# Patient Record
Sex: Female | Born: 2000 | Race: White | Hispanic: No | Marital: Single | Smoking: Never smoker
Health system: Northeastern US, Academic
[De-identification: ages and names within clinical notes are randomized; demographics above are authoritative.]

---
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
## 2019-07-02 IMAGING — CT CT NECK W/ CM
4 of 5 series · 16 of 33 positions shown, 18 images · IV contrast (omnipaque)
Comparison: None.

CLINICAL DATA: Sore throat stridor. Positive mono. Negative strep.

EXAM:
CT NECK WITH CONTRAST
TECHNIQUE: Multidetector CT imaging of the neck was performed using the
standard protocol following the bolus administration of intravenous
contrast.
CONTRAST:  75mL OMNIPAQUE IOHEXOL 300 MG/ML  SOLN

[Series 2: axial neck · axial · 0.56mm/px · z∈[-224,-98]mm · 4 of 105 slices shown]
[im 21/105  bone]
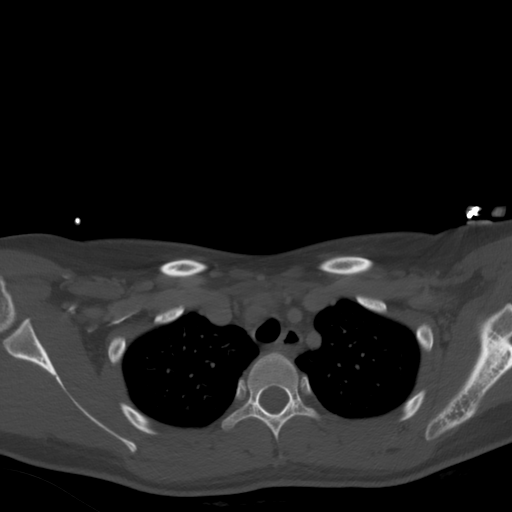
[im 42/105  bone]
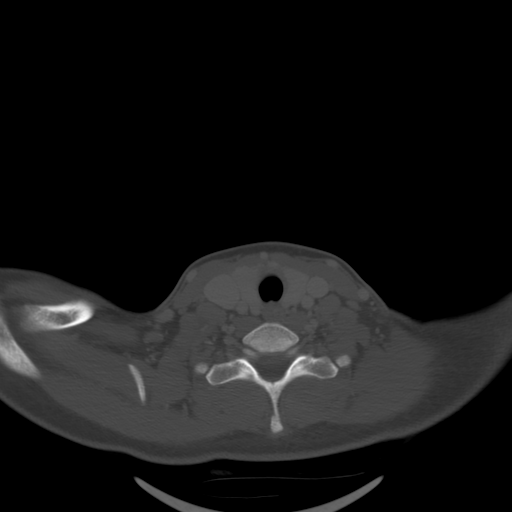
[im 63/105  bone]
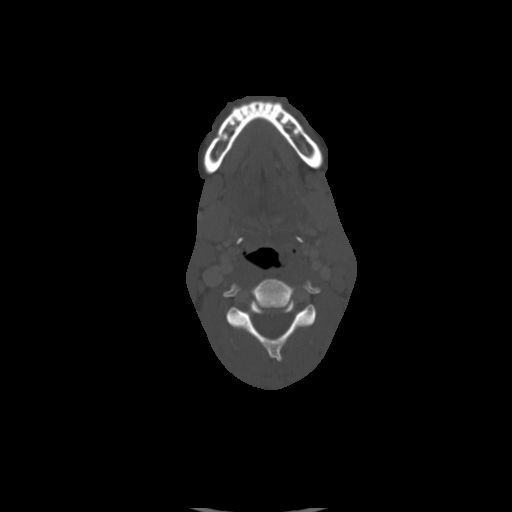
[im 84/105  bone]
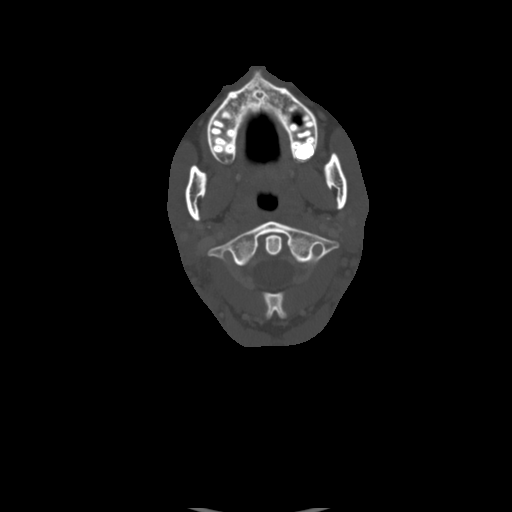

[Series 6: sag neck · sagittal · 0.42mm/px · 5 of 120 slices shown, 6 images]
[im 40/120  bone]
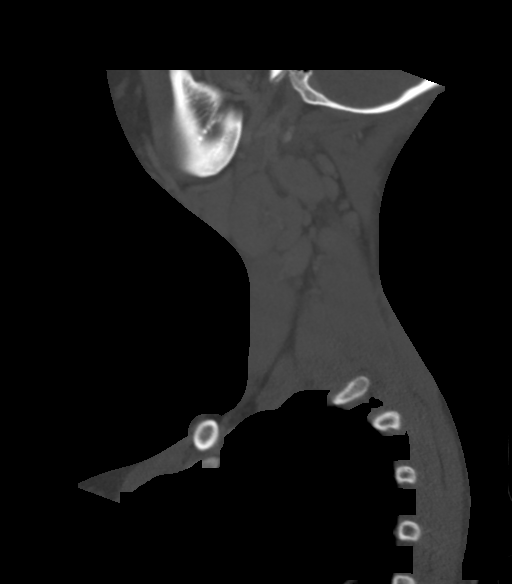
[im 50/120  bone]
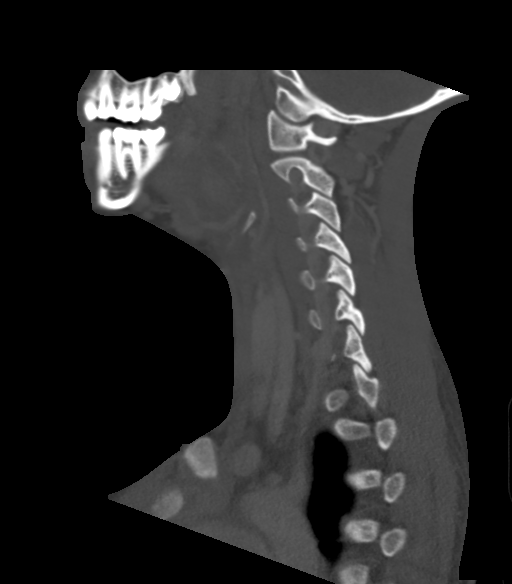
[im 60/120  soft-tissue]
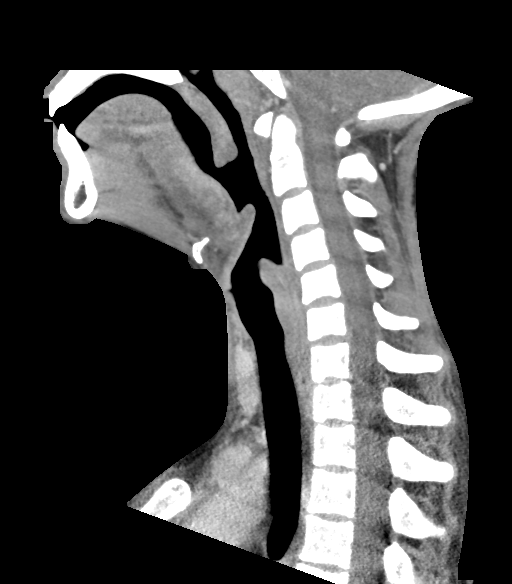
[im 60/120  bone]
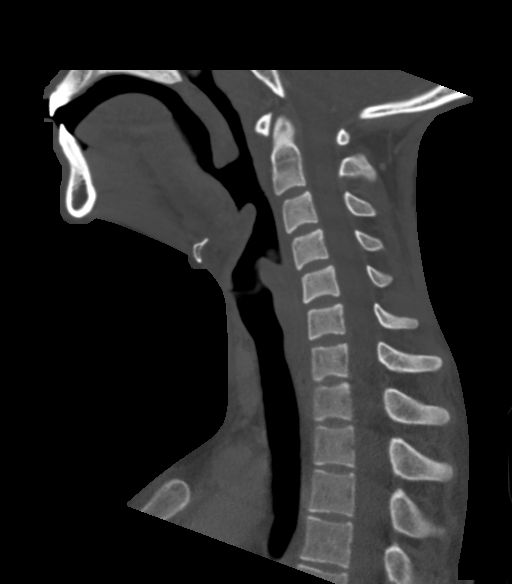
[im 70/120  bone]
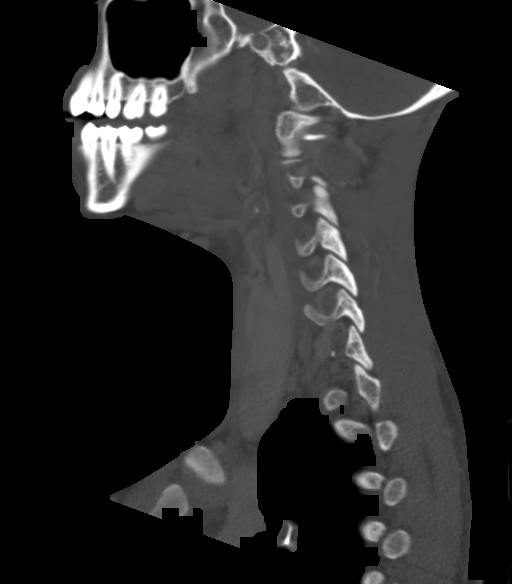
[im 80/120  bone]
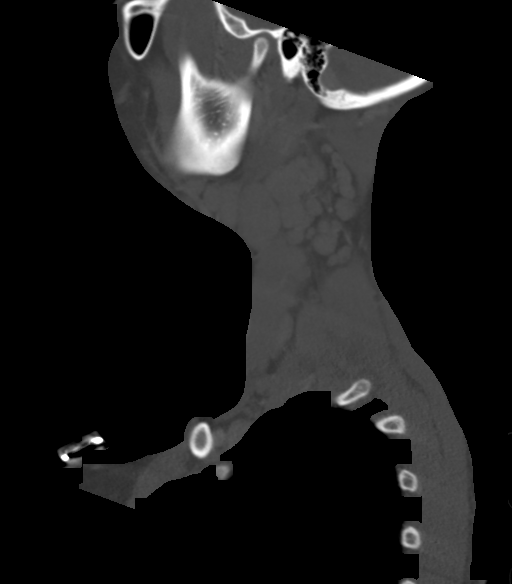

[Series 7: cor neck · coronal · 0.47mm/px · 3 of 107 slices shown]
[im 33/107  bone]
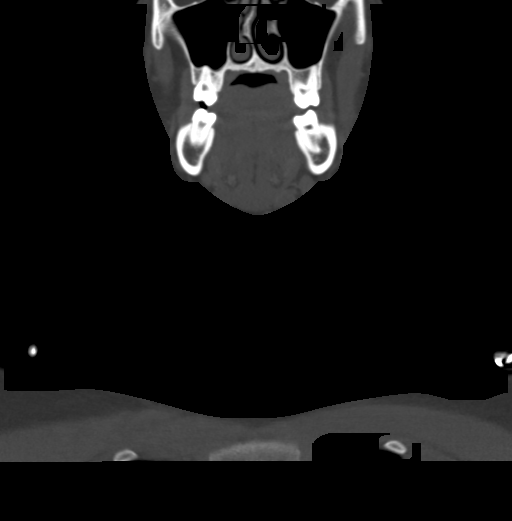
[im 47/107  bone]
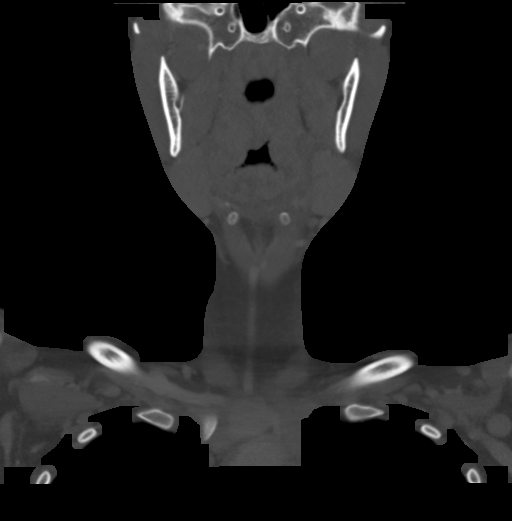
[im 61/107  bone]
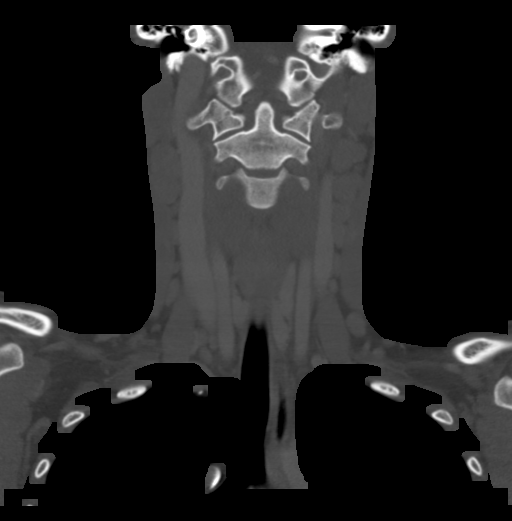

[Series 8: orthogonal ax · axial · 0.42mm/px · z∈[-268,-133]mm · 4 of 122 slices shown, 5 images]
[im 25/122  soft-tissue]
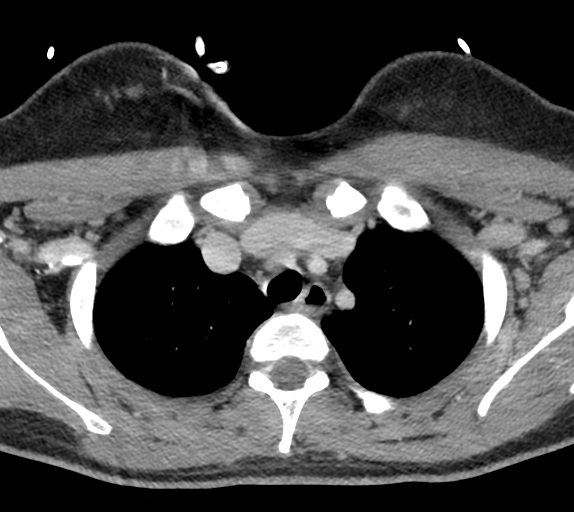
[im 25/122  bone]
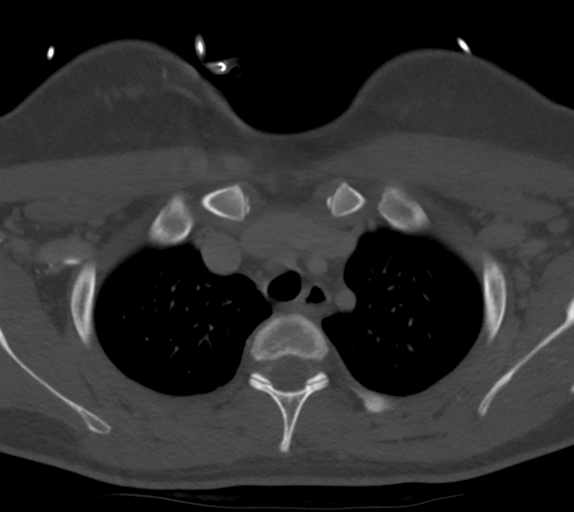
[im 49/122  bone]
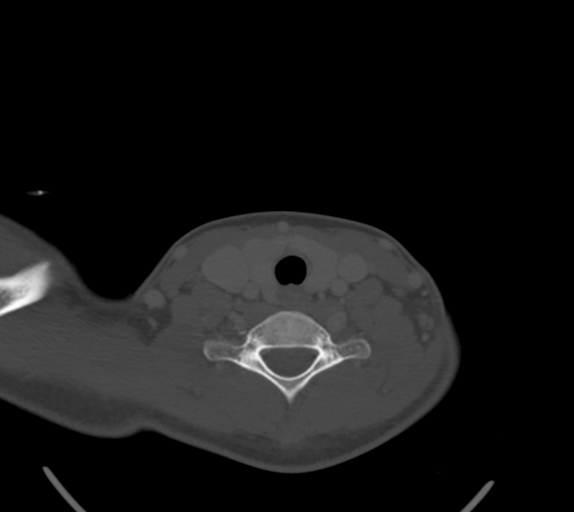
[im 73/122  bone]
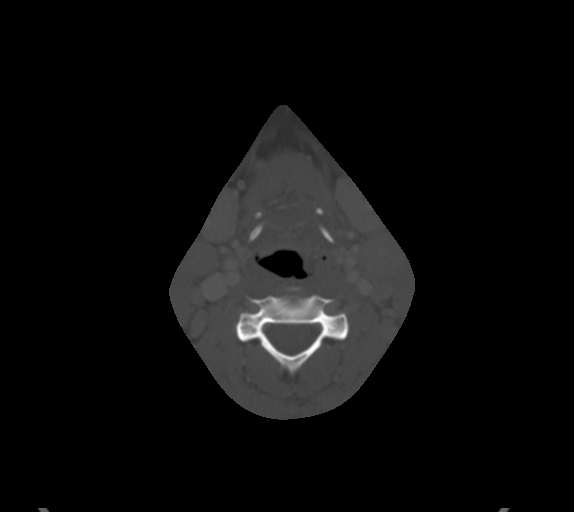
[im 97/122  bone]
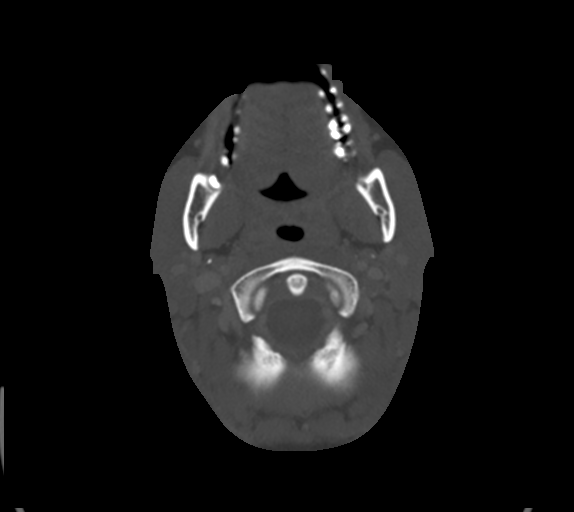

[16 of 33 positions shown; findings below may reference images not displayed]

FINDINGS: Pharynx and larynx: Extensive enlargement of the tonsils
bilaterally. Enlargement of the adenoid bilaterally. Epiglottis is
swollen. Edema in the left aryepiglottic fold and piriform sinus. No
airway compromise at this point. No parapharyngeal abscess. There is
mild parapharyngeal edema bilaterally

Salivary glands: No inflammation, mass, or stone.

Thyroid: Negative

Lymph nodes: Prominent lymphadenopathy bilaterally. Multiple
symmetric enlarged lymph nodes are present including submandibular
nodes, level 2, and level 3 nodes. Largest right level 2 node 20 mm.
Largest left level 2 node 19 mm. No necrotic nodes.

Vascular: Normal vascular enhancement.

Limited intracranial: Negative

Visualized orbits: Negative

Mastoids and visualized paranasal sinuses: Negative

Skeleton: Negative

Upper chest: Negative

Other: None
IMPRESSION: Findings compatible with extensive pharyngitis. Symmetric
enlargement of the tonsils and adenoids. Diffuse edema of the
epiglottis. Extensive edema in the left aryepiglottic fold and left
piriform sinus. No parapharyngeal abscess. Extensive symmetric
adenopathy

These results were called by telephone at the time of interpretation
on 12/08/2018 at [DATE] to Dr. who verbally acknowledged these
results.

## 2020-03-18 LAB — CHLAMYDIA/GC (EXT)
Chlamydia trachomatis (EXT): NEGATIVE
Neisseria gonorrhoeae (EXT): NEGATIVE

## 2021-04-06 ENCOUNTER — Encounter

## 2021-04-06 ENCOUNTER — Telehealth (INDEPENDENT_AMBULATORY_CARE_PROVIDER_SITE_OTHER)

## 2021-04-06 NOTE — Telephone Encounter (Signed)
Called Dr.Ruth Hazen's [phone] to request patient record. They will fax record to our office.

## 2021-04-11 ENCOUNTER — Other Ambulatory Visit

## 2021-04-12 ENCOUNTER — Encounter (INDEPENDENT_AMBULATORY_CARE_PROVIDER_SITE_OTHER): Admitting: Rheumatology

## 2021-04-12 ENCOUNTER — Other Ambulatory Visit
Admit: 2021-04-12 | Payer: PRIVATE HEALTH INSURANCE | Primary: Student in an Organized Health Care Education/Training Program

## 2021-04-12 ENCOUNTER — Ambulatory Visit
Admit: 2021-04-12 | Discharge: 2021-04-12 | Payer: PRIVATE HEALTH INSURANCE | Attending: Rheumatology | Primary: Student in an Organized Health Care Education/Training Program

## 2021-04-12 VITALS — BP 110/70 | HR 94 | Temp 97.3°F | Wt 114.0 lb

## 2021-04-12 DIAGNOSIS — M1990 Unspecified osteoarthritis, unspecified site: Secondary | ICD-10-CM

## 2021-04-12 LAB — PROTEIN, TOTAL: Protein, total: 7.7 g/dL (ref 6.0–8.4)

## 2021-04-12 LAB — ALBUMIN: Albumin: 4.2 g/dL (ref 3.2–5.0)

## 2021-04-12 LAB — FERRITIN: Ferritin: 52.9 ng/mL (ref 10.0–307.0)

## 2021-04-12 LAB — C4 COMPLEMENT: C4 Complement: 19.6 mg/dL (ref 12.0–57.0)

## 2021-04-12 LAB — C3 COMPLEMENT: C3 Complement: 126.2 mg/dL (ref 82.0–193.0)

## 2021-04-12 LAB — HEPATITIS C ANTIBODY: Hepatitis C antibody: NONREACTIVE

## 2021-04-12 LAB — CK: CK: 52 U/L (ref 30.0–200.0)

## 2021-04-12 LAB — HIV AB/AG SCREEN: HIV-1/2 Ab/Ag, P24 Ag screen: NONREACTIVE

## 2021-04-12 LAB — HEPATITIS B CORE ANTIBODY, IGM: Hepatitis B core antibody, IgM: NONREACTIVE

## 2021-04-12 LAB — HEPATITIS B SURFACE ANTIGEN: Hepatitis B surface antigen: NONREACTIVE

## 2021-04-12 LAB — TSH: TSH: 1.65 u[IU]/mL (ref 0.358–3.740)

## 2021-04-12 NOTE — Progress Notes (Signed)
[facility]  [address]  [address]  [place] [address]  [phone]  Subjective   HPI  20 year old woman presents to the rheumatology clinic for evaluation of joint pain, fatigue, and fevers.    In Fall 2021 the patient developed swollen lymph nodes, joint pain, fatigue, and elevated temperature which occurred 2-3 times in a 1 month span. Temperature was as high as 101. Fevers would generally last for 1-2 days with 7-10 days in between fevers. Several tests were completed in March 2022 which were negative/normal including ANA, RF, CRP, and ESR. Patient reports that her symptoms have become less frequent over the last two months, although are still ongoing. Joint pain is primarily involving lower back, bilateral hips, and occasionally her knees and ankles. Joint pain is worse at the end of the day. She takes ibuprofen as needed for pain relief. Patient also reports an intermittent facial rash, as well as frequent oral ulcerations. She showed me a picture of what look like a macular papular rash on her anterior thigh and shin. No discoid lesions, patchy alopecia, annular rash, Raynaud's phenomenon, sicca, pleuritic chest pain, bilateral lower extremity edema, loss of power or weakness.     Patient notes that she was hospitalized a couple years ago for severe mononucleosis, however recently tested negative for mono, flu, COVID. Also was negative for GC/Chlamydia. Patient has not had any other screening for infections or STDs. She denies a history of skin psoriasis, uveitis. Patient does note GI issues as a child but denies an IBD diagnosis. She also reports a history of chest tightness but denies any recent chest pain or difficulty breathing. Denies a family history of autoimmune conditions.      Patient Active Problem List   Diagnosis   ? Mononucleosis     Current Outpatient Medications   Medication Instructions   ? norethindrone-e.estradioL-iron (Lo Loestrin Fe) 1 mg-10 mcg  (24)/10 mcg (2) tablet 1 tablet, oral, Daily   ? Vyvanse 40 mg, oral, Every morning     No Known Allergies  Past Medical History:   Diagnosis Date   ? ADHD    ? Dysmenorrhea    ? Joint pain    ? Rash      Past Surgical History:   Procedure Laterality Date   ? DENTAL SURGERY      08/2012   ? WISDOM TOOTH EXTRACTION      04/2020     Family History   Problem Relation Name Age of Onset   ? Hypertension Mother     ? Other (Cysts in liver) Mother     ? Other (Healthy) Father       Social History     Tobacco Use   ? Smoking status: Never Smoker   ? Smokeless tobacco: Never Used   Vaping Use   ? Vaping Use: Never used   Substance Use Topics   ? Alcohol use: Yes     Comment: Monthly- 2-3   ? Drug use: Never     Review of Systems   Constitutional: Positive for fatigue.   Eyes:        Dry eye   Gastrointestinal: Positive for constipation and diarrhea.   Musculoskeletal: Positive for arthralgias, back pain and neck pain.   Neurological: Positive for headaches.   All other systems reviewed and are negative.      Objective   Visit Vitals  BP 110/70 (BP Location: Left arm, Patient Position: Sitting,  BP Cuff Size: Adult)   Pulse 94   Temp 36.3 ?C (97.3 ?F)   Wt 51.7 kg   SpO2 98%       Physical Exam  GEN: NAD, AAOx3     HEENT: Sclera anicteric, Conjunctiva: no injection, Oropharynx: pink W/O ulcers, erythema or exudate.   CHEST: Symmetric expansion, good air movement, CTA bilaterally without wheezes or rales.     HEART: Regular Rhythm, S1/S2 present, no loud murmur, rub or gallop.    SKIN: No Rash    MSK:    DIPs - full ROM, no synovitis  PIPs - full ROM, no synovitis  MCPs - full ROM, no synovitis  Wrists - no synovitis, normal flexion/extension  Elbows - no synovitis, normal  flexion/extension/pronation/supination  Shoulders - full ROM including forward flexion, internal and  external rotation, abduction; no localized tenderness in  subacromial bursa or bicip. tendon  Hips - full ROM including external and internal  rotation,  flexion; no bursal tenderness over greater trochanter  Knees - no effusion by patellar ballottement or bulge sign, normal  ROM, no local tenderness in region of anserine bursa or joint line   Ankles - no synovitis, normal flexion, extension, inversion,  eversion  Feet - no synovitis of small joints, no MTP squeeze tenderness  Back - Tenderness over bilateral SI joints      Test results from 12/28/2020:  ANA negative  RF negative  CRP 0.8 mg/L  ESR <1  WBC 7.05  Hgb 13.8  Hct 40.6  MCV 87  MCH 29.7  PLT 259    CG/Chlamydia negative (03/17/2020)      Assessment/Plan   20 year old woman with a history of mononucleosis presents to the rheumatology clinic for evaluation of joint pain, fatigue, and fever. In Fall 2021 the patient developed swollen lymph nodes, joint pain, fatigue, and fevers. Fevers would generally last for 1-2 days with 7-10 days in between fevers. Several tests were completed in March 2022 which were negative/normal including ANA, RF, CRP, and ESR. Patient reports that her symptoms have become less frequent over the last two months, although are still ongoing. Joint pain is primarily involving her lower back, bilateral hips, and occasionally her knees and ankles. Joint pain is worse at the end of the day. She takes ibuprofen as needed for pain relief. Patient also reports an intermittent facial rash, as well as frequent oral ulcerations. She has a history of mononucleosis for which she was hospitalized, however tells me she recently tested negative for mono, flu, COVID. Also was negative for GC/Chlamydia. She denies a history of skin psoriasis, uveitis, IBD. She also reports a history of chest tightness but denies any recent chest pain or difficulty breathing.    Multiple systemic symptoms including arthralgias, fevers, rash, fatigue  Differentials include seronegative spondyloarthritis given ongoing hip pain and tenderness over SI joints on exam. Fevers, lymphadenopathy, and rash may be  consistent with periodic fever syndrome or adult onset Still's disease. Additional infections should also be ruled out, so we will screen the patient for HBV, HCV, Lyme, and HIV. With temperature sensitivities and changes the patient will also check TSH today. Because the patient had an intermittent macular papular rash as seen on a picture that the patient showed me, will also check ANCA to rule out vasculitis. Lymphadenopathy also brings up the possibility of sarcoid so will check ACE and chest x-ray to rule out mediastinal lymphadenopathy. Will also x-ray the patient's bilateral sacroiliac joints to rule out  sacroiliitis or other inflammatory changes, and check HLA-B27 to screen for genetic susceptibility to spondyloarthritis. Finally will check C3/C4 complement levels, SPEP, CK, and ferritin levels.  - Follow up in 1 month, sooner if needed      By signing my name below, I, Linnell Fulling, attest that this documentation has been prepared under the direction and in the presence of Marcial Pacas MD.   Electronically Signed: Linnell Fulling 04/12/2021 11:20 AM

## 2021-04-13 LAB — ANTI-NEUTROPHILIC CYTOPLASMIC ANTIBODY
A-ANCA: <1:20 {titer}
C-ANCA: <1:20 {titer}
P-ANCA: <1:20 {titer}

## 2021-04-13 LAB — HEPATITIS B SURFACE ANTIBODY QUANT
Hepatitis B surface antibody conc: 42.6 m[IU]/mL — ABNORMAL HIGH (ref <8.00)
Hepatitis B surface antibody: REACTIVE — AB

## 2021-04-14 LAB — PROTEIN ELECTROPHORESIS, SERUM
Albumin (SPEP): 5.38 g/dL (ref 3.59–5.79)
Alpha 1 (SPEP): 0.15 g/dL (ref 0.06–0.26)
Alpha 2 (SPEP): 0.72 g/dL (ref 0.44–1.01)
Beta (SPEP): 0.69 g/dL (ref 0.45–1.03)
Gamma Globulin (SPEP): 0.75 g/dL (ref 0.48–1.26)
Protein Electrophoresis Interpretation: NORMAL

## 2021-04-14 LAB — LYME ANTIBODIES W/ RFLX IB: Lyme Ab Screen: <0.9 {index}

## 2021-04-18 LAB — ANGIOTENSIN CONVERTING ENZYME: Angiotensin Converting Enzyme: 26.9 U/L (ref 9–67)

## 2021-04-20 LAB — HLA-B27 (DISEASE ASSOCIATION): HLA B27: NEGATIVE

## 2021-05-13 ENCOUNTER — Inpatient Hospital Stay
Admit: 2021-05-13 | Payer: PRIVATE HEALTH INSURANCE | Primary: Student in an Organized Health Care Education/Training Program

## 2021-05-13 ENCOUNTER — Other Ambulatory Visit

## 2021-05-13 DIAGNOSIS — M1990 Unspecified osteoarthritis, unspecified site: Secondary | ICD-10-CM

## 2021-05-18 ENCOUNTER — Other Ambulatory Visit

## 2021-05-18 ENCOUNTER — Encounter (INDEPENDENT_AMBULATORY_CARE_PROVIDER_SITE_OTHER): Admitting: Rheumatology

## 2021-05-18 ENCOUNTER — Ambulatory Visit
Admit: 2021-05-18 | Payer: PRIVATE HEALTH INSURANCE | Attending: Rheumatology | Primary: Student in an Organized Health Care Education/Training Program

## 2021-05-18 VITALS — BP 110/66 | HR 98 | Temp 97.0°F | Ht 63.0 in | Wt 115.2 lb

## 2021-05-18 DIAGNOSIS — M1990 Unspecified osteoarthritis, unspecified site: Secondary | ICD-10-CM

## 2021-05-18 NOTE — Progress Notes (Signed)
 [facility]  [address]  [address]  [place] [address]  [phone]  Subjective   HPI  20 year old woman presents to the rheumatology clinic to follow up for SI joint pain.    In Fall 2021 the patient developed swollen lymph nodes, joint pain, fatigue, and elevated temperature which occurred 2-3 times in a 1 month span. Temperature was as high as 101. Fevers would generally last for 1-2 days with 7-10 days in between fevers. Several tests were completed in March 2022 which were negative/normal including ANA, RF, CRP, and ESR. Patient reports that her symptoms have become less frequent over the last two months, although are still ongoing. Joint pain is primarily involving lower back, bilateral hips, and occasionally her knees and ankles. Joint pain is worse at the end of the day. She takes ibuprofen as needed for pain relief. Patient also reports an intermittent facial rash, as well as frequent oral ulcerations. She showed me a picture of what look like a macular papular rash on her anterior thigh and shin. No discoid lesions, patchy alopecia, annular rash, Raynaud's phenomenon, sicca, pleuritic chest pain, bilateral lower extremity edema, loss of power or weakness.     Patient notes that she was hospitalized a couple years ago for severe mononucleosis, however recently tested negative for mono, flu, COVID. Also was negative for GC/Chlamydia. Patient has not had any other screening for infections or STDs. She denies a history of skin psoriasis, uveitis. Patient does note GI issues as a child but denies an IBD diagnosis. She also reports a history of chest tightness but denies any recent chest pain or difficulty breathing. Denies a family history of autoimmune conditions.    05/18/2021: Patient has been experiencing a tingling sensation of her left heel at night while sitting/laying down. She also continues to experience bilateral SI joint pain at night. Takes tylenol/advil  for pain relief as needed. Patient has not experienced a recurrence of fatigue, swollen lymph nodes, fevers, or rashes. No synovitis, large joint effusions or worsening prolonged morning stiffness.      Patient Active Problem List   Diagnosis   . Mononucleosis     Current Outpatient Medications   Medication Instructions   . norethindrone-e.estradioL-iron (Lo Loestrin Fe) 1 mg-10 mcg (24)/10 mcg (2) tablet 1 tablet, oral, Daily   . Vyvanse 40 mg, oral, Every morning     No Known Allergies  Past Medical History:   Diagnosis Date   . ADHD    . Dysmenorrhea    . Joint pain    . Mononucleosis 12/08/2018   . Rash      Past Surgical History:   Procedure Laterality Date   . DENTAL SURGERY      08/2012   . WISDOM TOOTH EXTRACTION      04/2020     Family History   Problem Relation Name Age of Onset   . Hypertension Mother     . Other (Cysts in liver) Mother     . Other (Healthy) Father       Social History     Tobacco Use   . Smoking status: Never Smoker   . Smokeless tobacco: Never Used   Vaping Use   . Vaping Use: Never used   Substance Use Topics   . Alcohol use: Yes     Comment: Monthly- 2-3   . Drug use: Never     Review of Systems   Gastrointestinal: Positive for constipation  and diarrhea.   Musculoskeletal: Positive for back pain.   Neurological: Positive for headaches.        Tingling of bilateral feet, left more than right   Hematological: Bruises/bleeds easily.   All other systems reviewed and are negative.      Objective   Visit Vitals  BP 110/66 (BP Location: Left arm, Patient Position: Sitting, BP Cuff Size: Adult)   Pulse 98   Temp 36.1 C (97 F)   Ht 1.6 m   Wt 52.3 kg   SpO2 98%   BMI 20.41 kg/m   BSA 1.52 m       Physical Exam  GEN: NAD, AAOx3     HEENT: Sclera anicteric, Conjunctiva: no injection, Oropharynx: pink W/O ulcers, erythema or exudate.   CHEST: Symmetric expansion, good air movement, CTA bilaterally without wheezes or rales.     HEART: Regular Rhythm, S1/S2 present, no loud murmur, rub or gallop.     SKIN: No Rash    MSK:    DIPs - full ROM, no synovitis  PIPs - full ROM, no synovitis  MCPs - full ROM, no synovitis  Wrists - no synovitis, normal flexion/extension  Elbows - no synovitis, normal  flexion/extension/pronation/supination  Shoulders - full ROM including forward flexion, internal and  external rotation, abduction; no localized tenderness in  subacromial bursa or bicip. tendon  Hips - full ROM including external and internal rotation,  flexion; no bursal tenderness over greater trochanter  Knees - no effusion by patellar ballottement or bulge sign, normal  ROM, no local tenderness in region of anserine bursa or joint line   Ankles - no synovitis, normal flexion, extension, inversion,  eversion  Feet - no synovitis of small joints, no MTP squeeze tenderness  Back - Tenderness over bilateral SI joints      Bilateral SI joint x-ray from 05/13/2021:  No radiographic evidence of sacroiliitis.     Chest x-ray from 05/13/2021:  Normal Chest.    Test results from 04/12/2021:  HLA-B27 negative  Lyme negative  C3 complement 126.2  C4 complement 19.6  HBV negative  HCV negative  HIV negative  ANCA negative  ACE 26.9  SPEP: Normal pattern. Paraprotein not detected  Ferritin 52.9  CK 52  TSH 1.65    Test results from 12/28/2020:  ANA negative  RF negative  CRP 0.8 mg/L  ESR <1  WBC 7.05  Hgb 13.8  Hct 40.6  MCV 87  MCH 29.7  PLT 259    CG/Chlamydia negative (03/17/2020)      Assessment/Plan   20 year old woman with a history of mononucleosis presents to the rheumatology clinic to follow up for joint pain, fatigue, and fever. In Fall 2021 the patient developed swollen lymph nodes, joint pain, fatigue, and fevers. Fevers would generally last for 1-2 days with 7-10 days in between fevers. Several tests were completed in March 2022 which were negative/normal including ANA, RF, CRP, and ESR. Patient reports that her symptoms have become less frequent over the last two months. Joint pain is primarily involving her lower  back, bilateral hips, and occasionally her knees and ankles.    Multiple systemic symptoms including arthralgias, fevers, rash, fatigue  Our additional workup was unremarkable, including HLA-B27 negative, infection screening negative (Lyme, HIV, HBV, HCV), normal C3/C4 complement levels, ANCA negative, SPEP negative, and normal ACE, CK, TSH, and ferritin levels. Bilateral SI joint x-ray did not indicate any sacroiliitis, and her chest x-ray was normal. Today the patient complains  primarily of ongoing bilateral SI joint pain. On exam she has tenderness over her bilateral SI joints. Patient has not had a recurrence of her fevers, fatigue, rash, or swollen lymph nodes. 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 Fevers, lymphadenopathy, and rash may have been due to a self-limiting infection. Without any recent recurrence of these symptoms I doubt periodic fever syndrome. As a further diagnostic test will plan to complete an MRI of the patient's bilateral SI joints when she returns home from college in the winter. In the interim I recommended that she use tylenol/advil for pain relief as needed.  - Follow up in 4 months, sooner if needed        By signing my name below, I, Linnell Fulling, attest that this documentation has been prepared under the direction and in the presence of Marcial Pacas MD.   Electronically Signed: Linnell Fulling 05/18/2021 5:45 PM
